# Patient Record
Sex: Male | Born: 1975 | Race: White | Hispanic: No | Marital: Married | Smoking: Current every day smoker
Health system: Southern US, Community
[De-identification: ages and names within clinical notes are randomized; demographics above are authoritative.]

## PROBLEM LIST (undated history)

## (undated) DIAGNOSIS — K219 Gastro-esophageal reflux disease without esophagitis: Secondary | ICD-10-CM

## (undated) DIAGNOSIS — F32A Depression, unspecified: Secondary | ICD-10-CM

## (undated) DIAGNOSIS — I10 Essential (primary) hypertension: Secondary | ICD-10-CM

## (undated) DIAGNOSIS — E119 Type 2 diabetes mellitus without complications: Secondary | ICD-10-CM

## (undated) HISTORY — DX: Essential (primary) hypertension: I10

## (undated) HISTORY — DX: Type 2 diabetes mellitus without complications: E11.9

## (undated) HISTORY — DX: Gastro-esophageal reflux disease without esophagitis: K21.9

## (undated) HISTORY — DX: Depression, unspecified: F32.A

---

## 2008-06-26 ENCOUNTER — Emergency Department: Payer: Self-pay | Admitting: Emergency Medicine

## 2009-03-26 IMAGING — CT CT PARANASAL SINUSES W/O CM
1 series · 16 of 28 positions shown, 20 images · non-contrast
Comparison: none

REASON FOR EXAM: right facial swelling
COMMENTS:

PROCEDURE:     CT  - CT SINUSES WITHOUT CONTRAST  - June 26, 2008  [DATE]
RESULT:
HISTORY: RIGHT facial swelling.

[Series 2: sinus · axial · 0.31mm/px · z∈[-61,+57]mm · 16 of 28 slices shown, 20 images]
[im 2/28  brain]
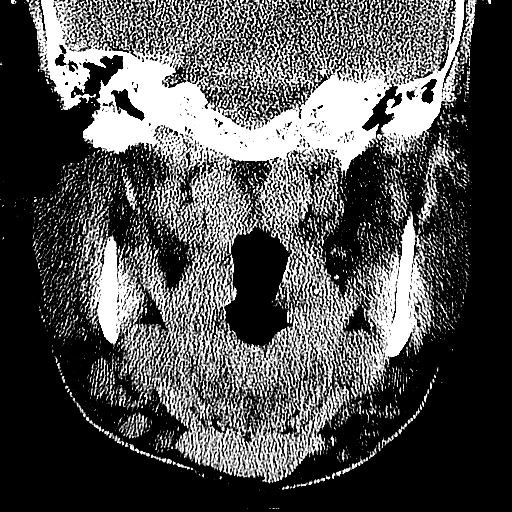
[im 2/28  bone]
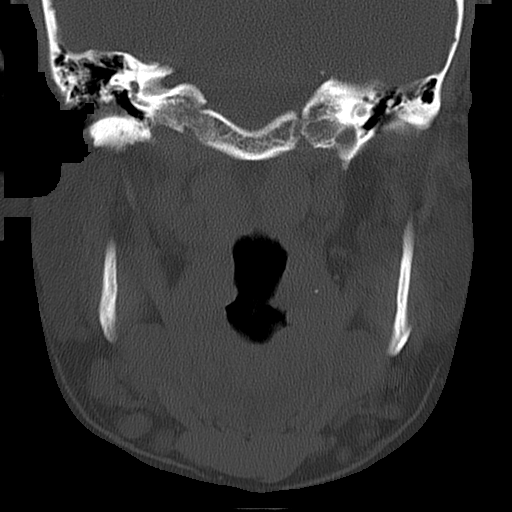
[im 4/28  bone]
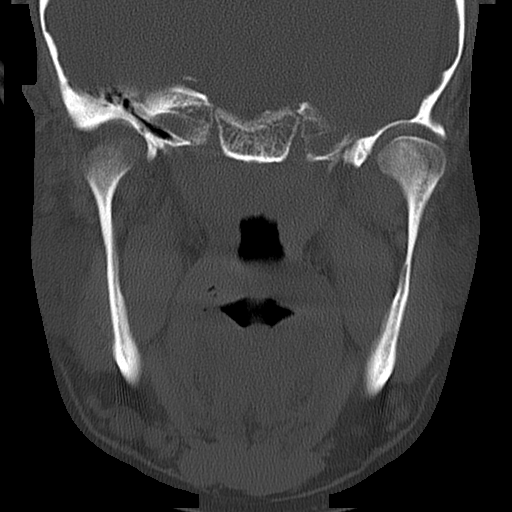
[im 6/28  bone]
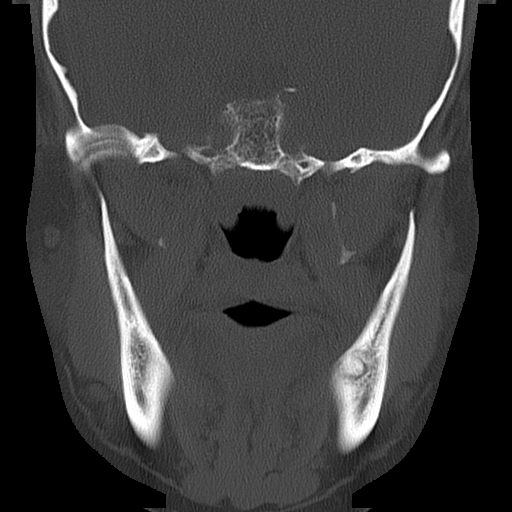
[im 7/28  bone]
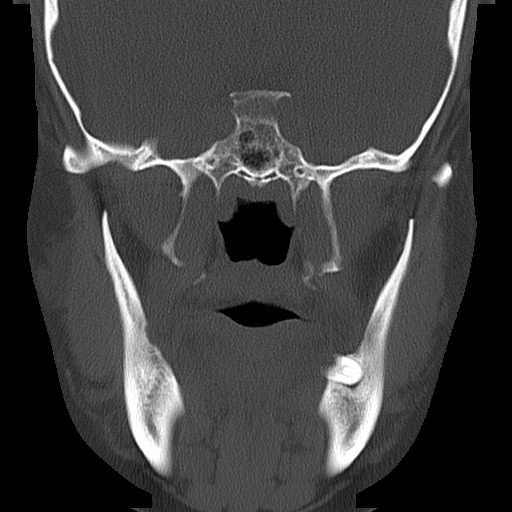
[im 9/28  brain]
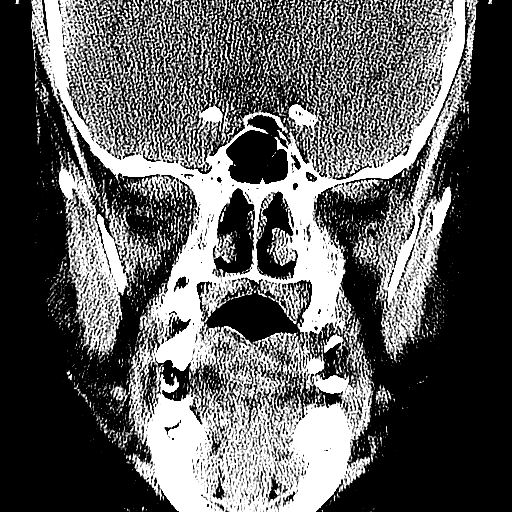
[im 9/28  bone]
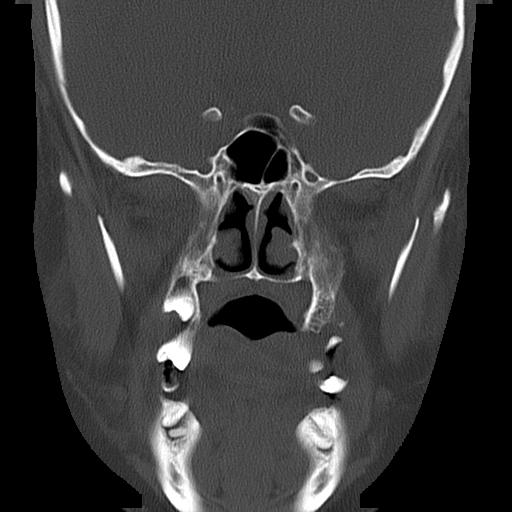
[im 10/28  bone]
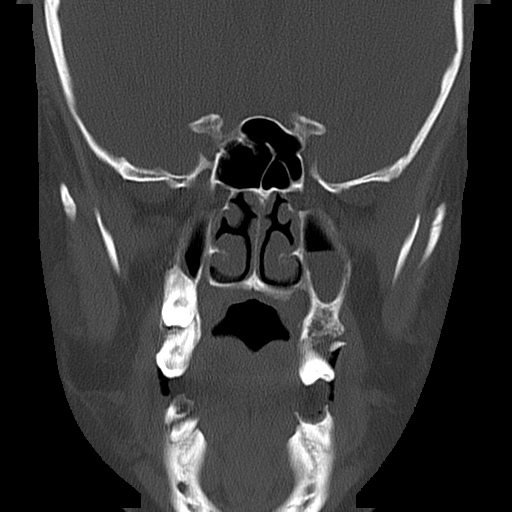
[im 12/28  bone]
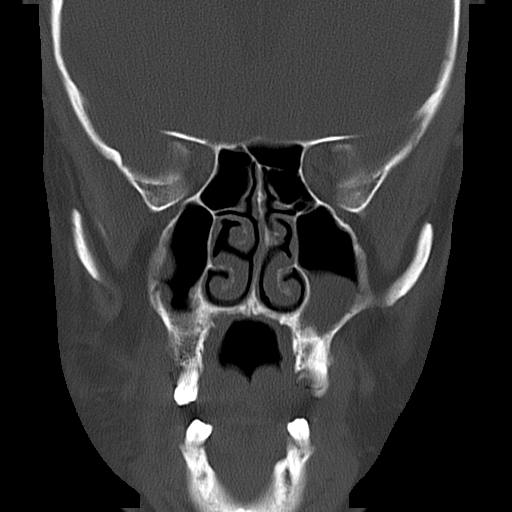
[im 14/28  bone]
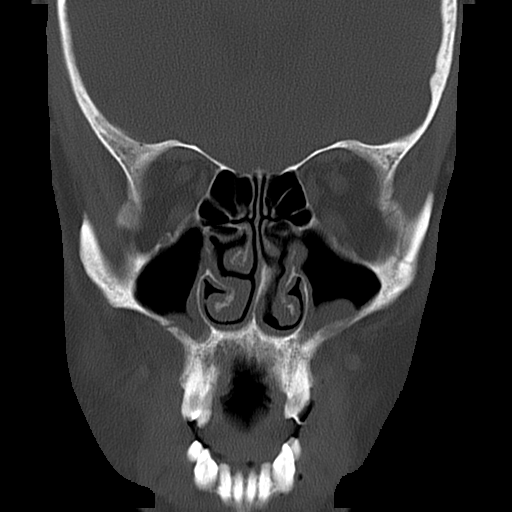
[im 15/28  brain]
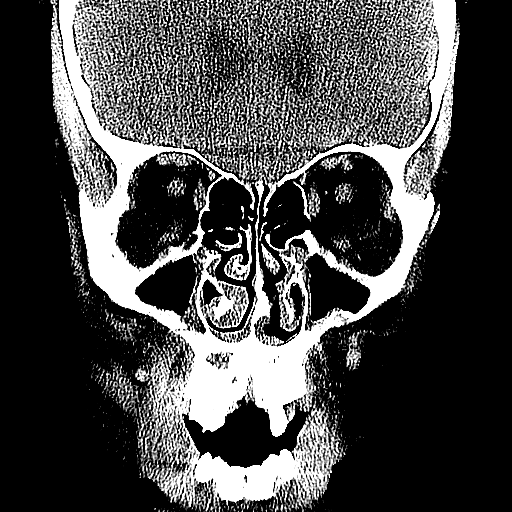
[im 15/28  bone]
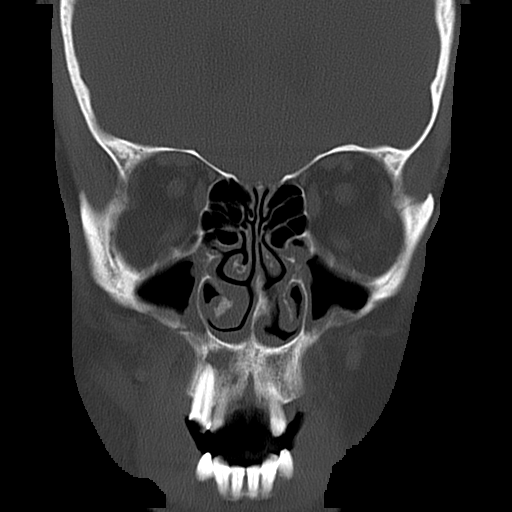
[im 17/28  bone]
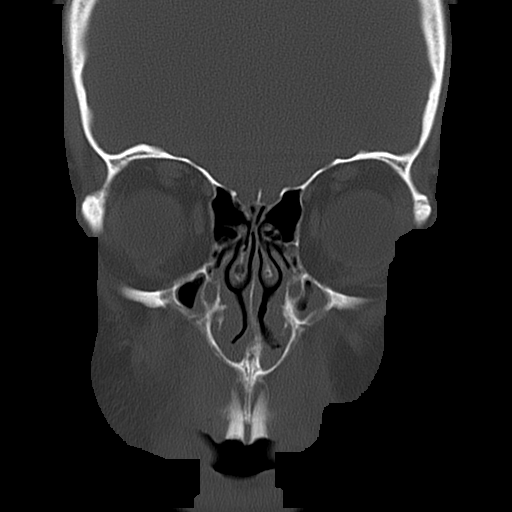
[im 19/28  bone]
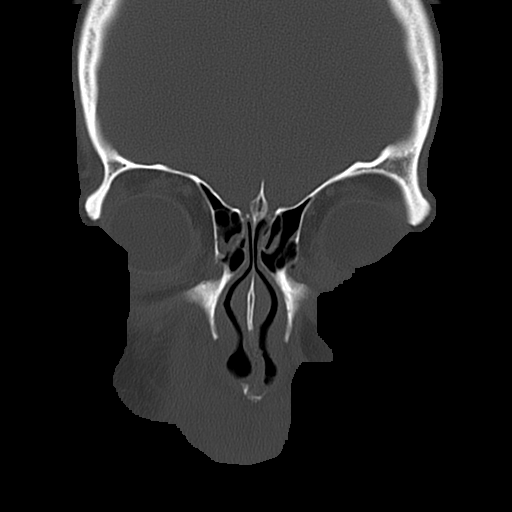
[im 20/28  bone]
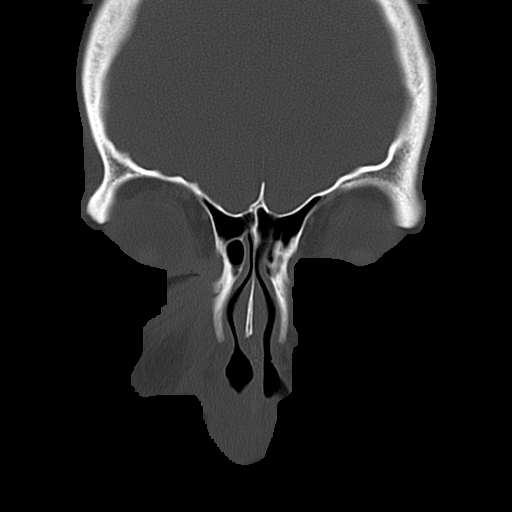
[im 22/28  brain]
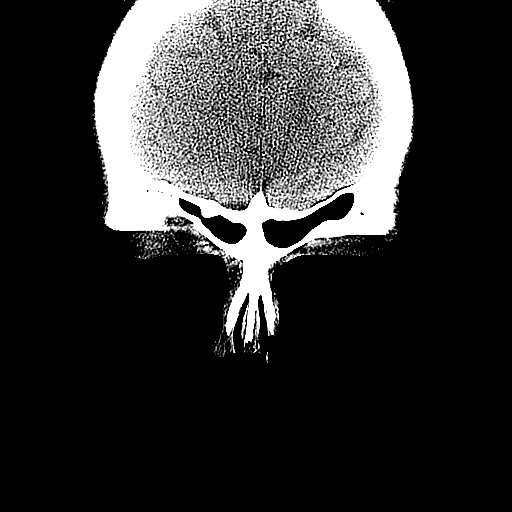
[im 22/28  bone]
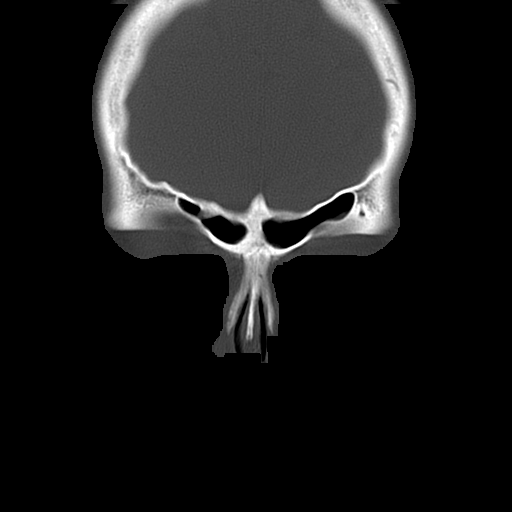
[im 23/28  bone]
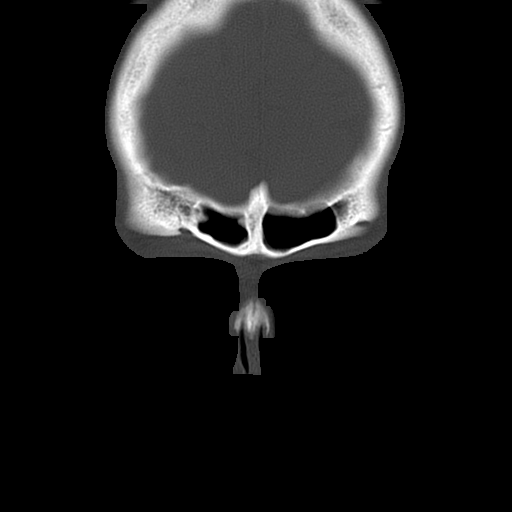
[im 25/28  bone]
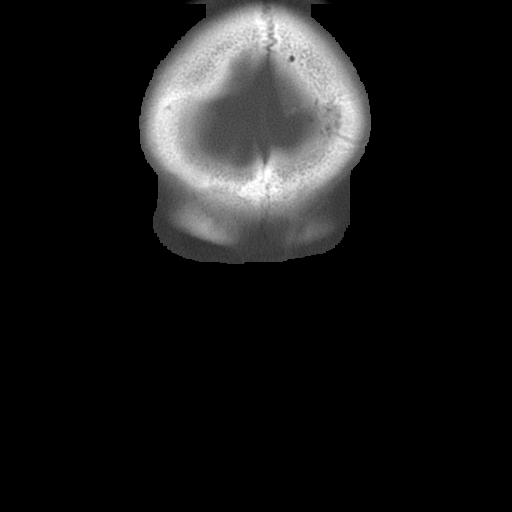
[im 27/28  bone]
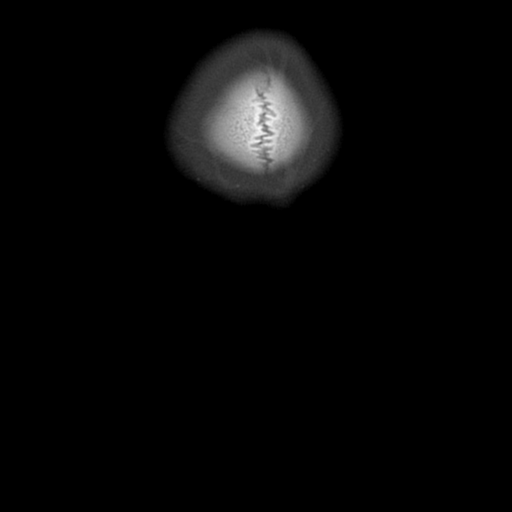

[16 of 28 positions shown; findings below may reference images not displayed]

FINDINGS: Frontal sinuses are clear.  Bilateral ethmoid sinus mucosal
thickening is noted.  Bilateral maxillary sinus mucosal thickening is
present. A mucus retention cyst is noted in the LEFT maxillary sinus.
Nasopharynx is clear. No acute bony abnormalities are identified.  RIGHT
facial swelling is present. No evidence of abscess formation.
IMPRESSION: 1.     Mild ethmoidal and maxillary sinus disease noted as described above.
There is mild mucosal thickening in the ethmoidal and maxillary sinuses.
Small mucus retention cyst in the LEFT maxillary sinus. Also noted is
narrowing of the maxillary ostia from mucosal thickening.
2.     Mild RIGHT facial swelling.  No evidence of abscess formation.

This report was phoned to the patient's physician.

## 2012-06-14 ENCOUNTER — Emergency Department: Payer: Self-pay | Admitting: Emergency Medicine

## 2018-07-09 ENCOUNTER — Other Ambulatory Visit: Payer: Self-pay | Admitting: Orthopedic Surgery

## 2018-07-09 DIAGNOSIS — M25562 Pain in left knee: Secondary | ICD-10-CM

## 2018-07-09 DIAGNOSIS — M2392 Unspecified internal derangement of left knee: Secondary | ICD-10-CM

## 2018-07-09 DIAGNOSIS — M25362 Other instability, left knee: Secondary | ICD-10-CM

## 2018-07-09 DIAGNOSIS — G8929 Other chronic pain: Secondary | ICD-10-CM

## 2018-07-29 ENCOUNTER — Ambulatory Visit
Admission: RE | Admit: 2018-07-29 | Discharge: 2018-07-29 | Disposition: A | Discharge status: Home / Self Care | Payer: Medicaid Other | Source: Ambulatory Visit | Attending: Orthopedic Surgery | Admitting: Orthopedic Surgery

## 2018-07-29 DIAGNOSIS — M2392 Unspecified internal derangement of left knee: Secondary | ICD-10-CM | POA: Diagnosis not present

## 2018-07-29 DIAGNOSIS — M25562 Pain in left knee: Secondary | ICD-10-CM | POA: Diagnosis present

## 2018-07-29 DIAGNOSIS — M25362 Other instability, left knee: Secondary | ICD-10-CM | POA: Diagnosis present

## 2018-07-29 DIAGNOSIS — G8929 Other chronic pain: Secondary | ICD-10-CM

## 2018-07-29 DIAGNOSIS — M23301 Other meniscus derangements, unspecified lateral meniscus, left knee: Secondary | ICD-10-CM | POA: Insufficient documentation

## 2018-10-13 DIAGNOSIS — R202 Paresthesia of skin: Secondary | ICD-10-CM | POA: Insufficient documentation

## 2018-10-13 DIAGNOSIS — R200 Anesthesia of skin: Secondary | ICD-10-CM | POA: Insufficient documentation

## 2018-10-13 DIAGNOSIS — R252 Cramp and spasm: Secondary | ICD-10-CM | POA: Insufficient documentation

## 2021-09-24 DIAGNOSIS — R94131 Abnormal electromyogram [EMG]: Secondary | ICD-10-CM | POA: Insufficient documentation

## 2021-09-24 DIAGNOSIS — Z79899 Other long term (current) drug therapy: Secondary | ICD-10-CM | POA: Insufficient documentation

## 2021-09-24 DIAGNOSIS — M899 Disorder of bone, unspecified: Secondary | ICD-10-CM | POA: Insufficient documentation

## 2021-09-24 DIAGNOSIS — G894 Chronic pain syndrome: Secondary | ICD-10-CM | POA: Insufficient documentation

## 2021-09-24 DIAGNOSIS — G5603 Carpal tunnel syndrome, bilateral upper limbs: Secondary | ICD-10-CM | POA: Insufficient documentation

## 2021-09-24 DIAGNOSIS — Z789 Other specified health status: Secondary | ICD-10-CM | POA: Insufficient documentation
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
## 2021-09-25 ENCOUNTER — Ambulatory Visit: Payer: Medicaid Other | Attending: Pain Medicine | Admitting: Pain Medicine

## 2021-09-25 ENCOUNTER — Encounter: Payer: Self-pay | Admitting: Pain Medicine

## 2021-09-25 ENCOUNTER — Other Ambulatory Visit: Payer: Self-pay

## 2021-09-25 VITALS — BP 145/86 | HR 75 | Temp 98.1°F | Resp 16 | Ht 70.0 in | Wt 265.0 lb

## 2021-09-25 DIAGNOSIS — M25511 Pain in right shoulder: Secondary | ICD-10-CM | POA: Diagnosis present

## 2021-09-25 DIAGNOSIS — Z789 Other specified health status: Secondary | ICD-10-CM | POA: Diagnosis present

## 2021-09-25 DIAGNOSIS — M545 Low back pain, unspecified: Secondary | ICD-10-CM | POA: Diagnosis present

## 2021-09-25 DIAGNOSIS — M899 Disorder of bone, unspecified: Secondary | ICD-10-CM | POA: Insufficient documentation

## 2021-09-25 DIAGNOSIS — Z79899 Other long term (current) drug therapy: Secondary | ICD-10-CM | POA: Diagnosis present

## 2021-09-25 DIAGNOSIS — M25552 Pain in left hip: Secondary | ICD-10-CM | POA: Diagnosis present

## 2021-09-25 DIAGNOSIS — R200 Anesthesia of skin: Secondary | ICD-10-CM | POA: Diagnosis present

## 2021-09-25 DIAGNOSIS — M79642 Pain in left hand: Secondary | ICD-10-CM | POA: Diagnosis present

## 2021-09-25 DIAGNOSIS — M79604 Pain in right leg: Secondary | ICD-10-CM | POA: Diagnosis present

## 2021-09-25 DIAGNOSIS — G8929 Other chronic pain: Secondary | ICD-10-CM | POA: Diagnosis present

## 2021-09-25 DIAGNOSIS — M47816 Spondylosis without myelopathy or radiculopathy, lumbar region: Secondary | ICD-10-CM | POA: Diagnosis present

## 2021-09-25 DIAGNOSIS — M5136 Other intervertebral disc degeneration, lumbar region: Secondary | ICD-10-CM | POA: Diagnosis present

## 2021-09-25 DIAGNOSIS — R202 Paresthesia of skin: Secondary | ICD-10-CM | POA: Insufficient documentation

## 2021-09-25 DIAGNOSIS — G5603 Carpal tunnel syndrome, bilateral upper limbs: Secondary | ICD-10-CM | POA: Diagnosis present

## 2021-09-25 DIAGNOSIS — M533 Sacrococcygeal disorders, not elsewhere classified: Secondary | ICD-10-CM | POA: Insufficient documentation

## 2021-09-25 DIAGNOSIS — M79605 Pain in left leg: Secondary | ICD-10-CM | POA: Diagnosis present

## 2021-09-25 DIAGNOSIS — M5126 Other intervertebral disc displacement, lumbar region: Secondary | ICD-10-CM | POA: Diagnosis present

## 2021-09-25 DIAGNOSIS — R94131 Abnormal electromyogram [EMG]: Secondary | ICD-10-CM | POA: Diagnosis present

## 2021-09-25 DIAGNOSIS — G894 Chronic pain syndrome: Secondary | ICD-10-CM | POA: Diagnosis present

## 2021-09-30 ENCOUNTER — Telehealth: Payer: Self-pay

## 2021-09-30 LAB — COMPLIANCE DRUG ANALYSIS, UR

## 2021-09-30 NOTE — Telephone Encounter (Signed)
Insurance denied auth for the lumbar MRI

## 2021-10-03 LAB — COMP. METABOLIC PANEL (12)
AST: 21 IU/L (ref 0–40)
Albumin/Globulin Ratio: 1.8 (ref 1.2–2.2)
Albumin: 4.5 g/dL (ref 4.0–5.0)
Alkaline Phosphatase: 128 IU/L — ABNORMAL HIGH (ref 44–121)
BUN/Creatinine Ratio: 20 (ref 9–20)
BUN: 17 mg/dL (ref 6–24)
Bilirubin Total: 0.2 mg/dL (ref 0.0–1.2)
Calcium: 9.9 mg/dL (ref 8.7–10.2)
Chloride: 101 mmol/L (ref 96–106)
Creatinine, Ser: 0.84 mg/dL (ref 0.76–1.27)
Globulin, Total: 2.5 g/dL (ref 1.5–4.5)
Glucose: 153 mg/dL — ABNORMAL HIGH (ref 70–99)
Potassium: 4.3 mmol/L (ref 3.5–5.2)
Sodium: 140 mmol/L (ref 134–144)
Total Protein: 7 g/dL (ref 6.0–8.5)
eGFR: 110 mL/min/{1.73_m2} (ref >59)

## 2021-10-03 LAB — 25-HYDROXY VITAMIN D LCMS D2+D3
25-Hydroxy, Vitamin D-2: <1 ng/mL
25-Hydroxy, Vitamin D-3: 10 ng/mL
25-Hydroxy, Vitamin D: 10 ng/mL — ABNORMAL LOW

## 2021-10-03 LAB — MAGNESIUM: Magnesium: 2.1 mg/dL (ref 1.6–2.3)

## 2021-10-03 LAB — VITAMIN B12: Vitamin B-12: 577 pg/mL (ref 232–1245)

## 2021-10-03 LAB — SEDIMENTATION RATE: Sed Rate: 14 mm/hr (ref 0–15)

## 2021-10-03 LAB — C-REACTIVE PROTEIN: CRP: 9 mg/L (ref 0–10)

## 2021-11-25 ENCOUNTER — Encounter: Payer: Self-pay | Admitting: Pain Medicine

## 2021-11-25 DIAGNOSIS — E559 Vitamin D deficiency, unspecified: Secondary | ICD-10-CM | POA: Insufficient documentation
